# Patient Record
Sex: Female | Born: 1960 | Race: Black or African American | Hispanic: No | Marital: Married
Health system: Southern US, Community
[De-identification: ages and names within clinical notes are randomized; demographics above are authoritative.]

## PROBLEM LIST (undated history)

## (undated) HISTORY — PX: BREAST BIOPSY: SHX20

---

## 2007-01-13 ENCOUNTER — Other Ambulatory Visit: Admission: RE | Admit: 2007-01-13 | Discharge: 2007-01-13 | Payer: Self-pay | Admitting: *Deleted

## 2007-01-31 ENCOUNTER — Encounter: Admission: RE | Admit: 2007-01-31 | Discharge: 2007-01-31 | Payer: Self-pay | Admitting: *Deleted

## 2007-02-11 ENCOUNTER — Encounter: Admission: RE | Admit: 2007-02-11 | Discharge: 2007-02-11 | Payer: Self-pay | Admitting: *Deleted

## 2007-02-12 ENCOUNTER — Encounter (INDEPENDENT_AMBULATORY_CARE_PROVIDER_SITE_OTHER): Payer: Self-pay | Admitting: *Deleted

## 2007-02-12 ENCOUNTER — Encounter: Admission: RE | Admit: 2007-02-12 | Discharge: 2007-02-12 | Payer: Self-pay | Admitting: *Deleted

## 2007-07-30 ENCOUNTER — Encounter: Admission: RE | Admit: 2007-07-30 | Discharge: 2007-07-30 | Payer: Self-pay | Admitting: *Deleted

## 2008-01-29 ENCOUNTER — Other Ambulatory Visit: Admission: RE | Admit: 2008-01-29 | Discharge: 2008-01-29 | Payer: Self-pay | Admitting: Endocrinology

## 2008-02-02 ENCOUNTER — Encounter: Admission: RE | Admit: 2008-02-02 | Discharge: 2008-02-02 | Payer: Self-pay | Admitting: Family Medicine

## 2008-06-01 IMAGING — MG MM DIAGNOSTIC LTD LEFT
2 series · 2 of 2 positions shown · non-contrast
Comparison: none

[REDACTED] LEFT
CC and MLO view(s) were taken of the left breast.

LEFT BREAST ULTRASOUND
DIGITAL LIMITED LEFT DIAGNOSTIC MAMMOGRAM AND LEFT BREAST ULTRASOUND:
CLINICAL DATA: Abnormal screening mammogram.

[L CC]
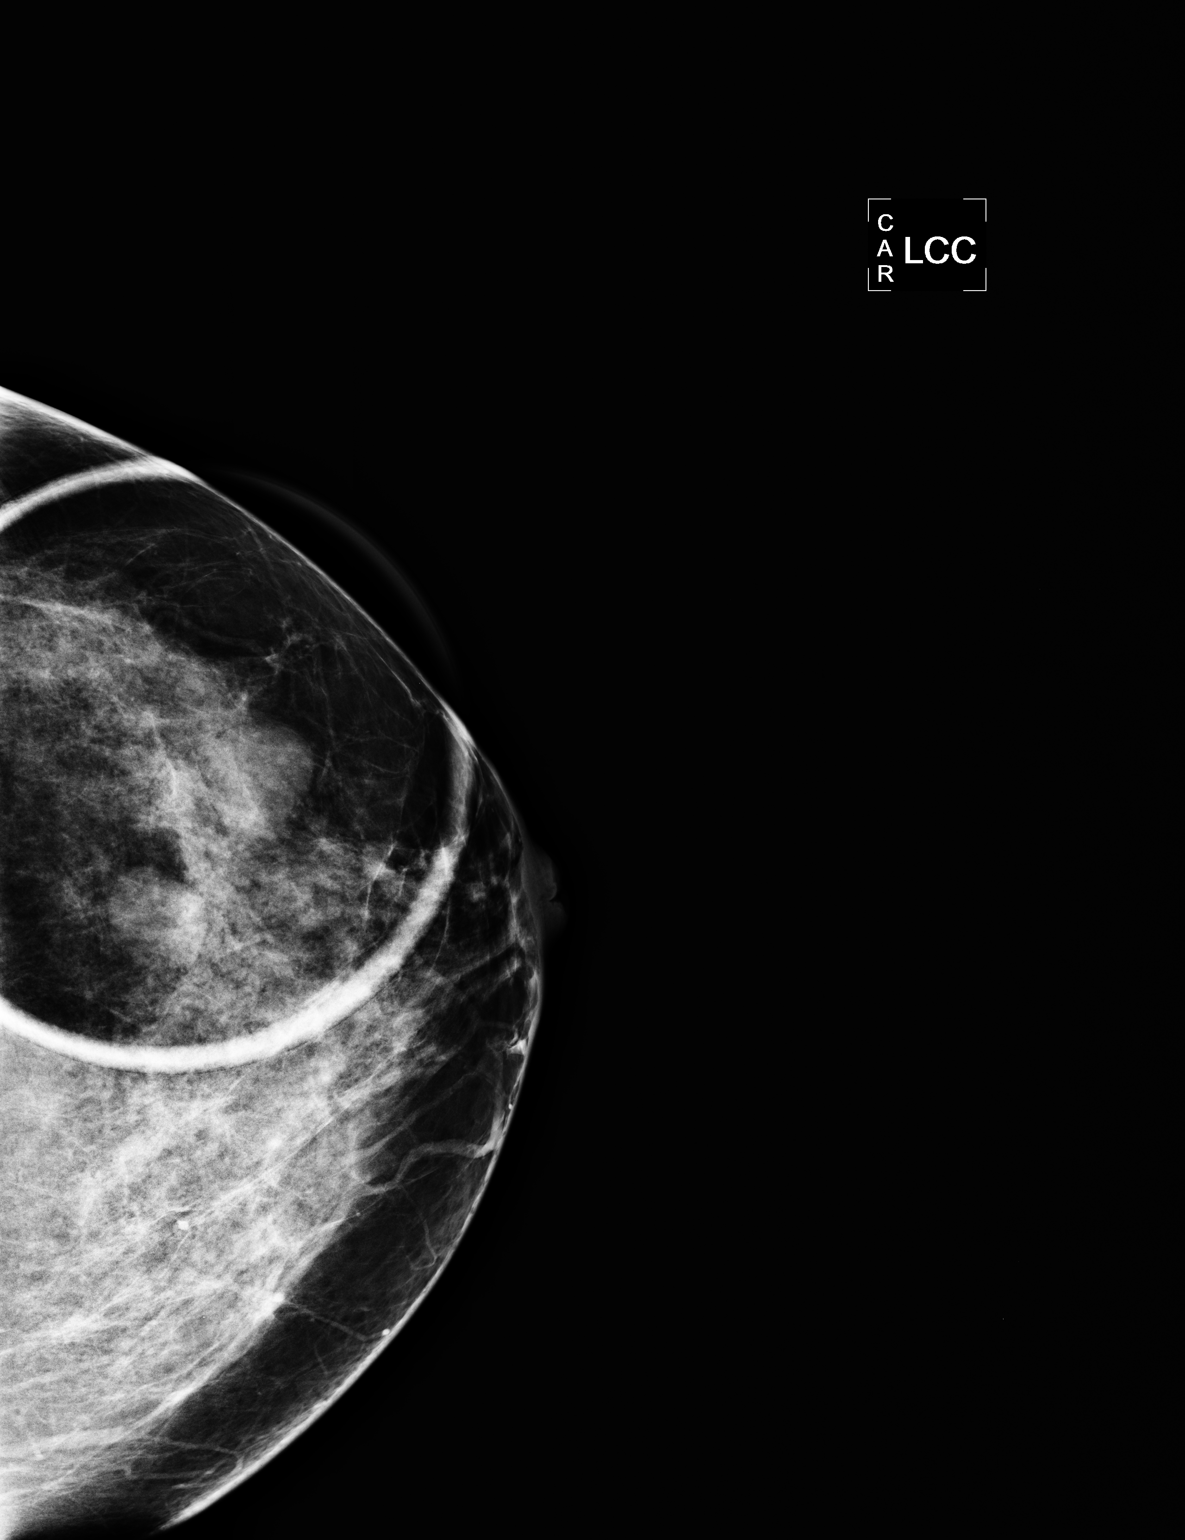

[L MLO]
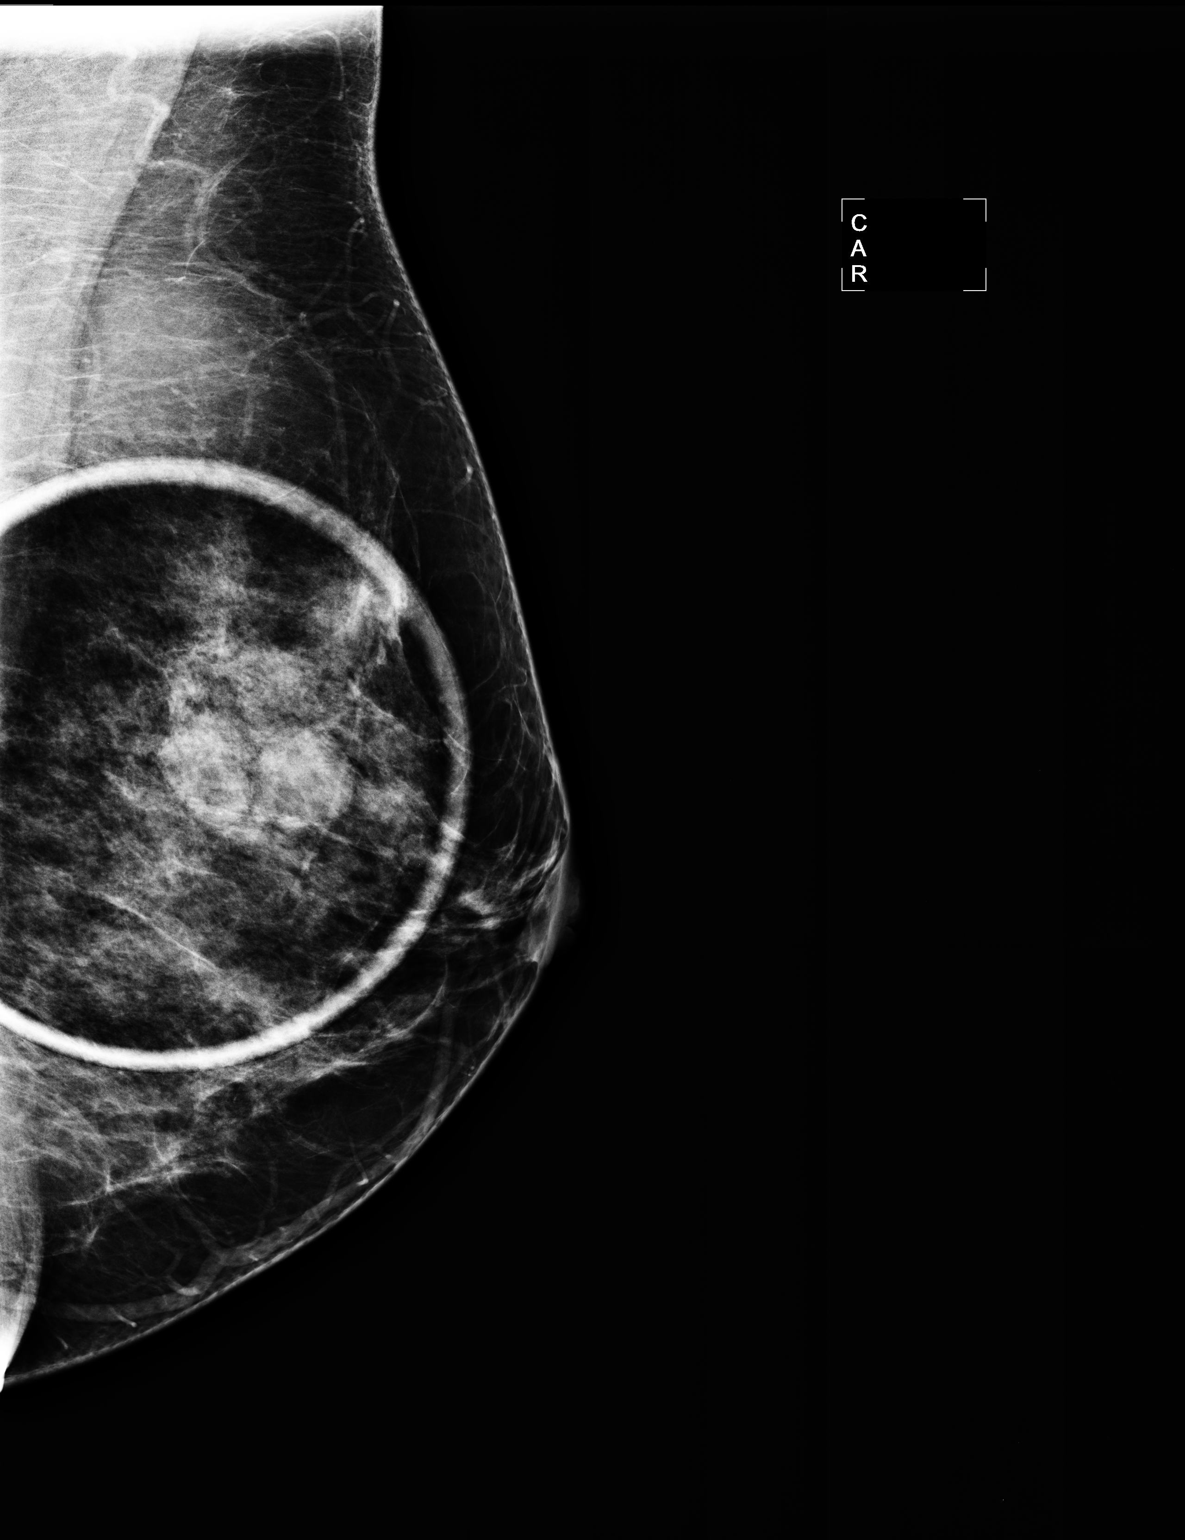

[2 of 2 positions shown; findings below may reference images not displayed]

Focally compressed left CC and left MLO views do demonstrate two partially circumscribed nodules 
within the upper outer quadrant of the left breast located at approximately the 1-2 o'clock 
position.  Review of prior mammograms from [REDACTED] dated 08/11/01 do not demonstrate these
lesions to be definitely present.  There is a moderately dense breast parenchymal pattern.

Left breast ultrasound demonstrates a 1.4 cm sized simple-appearing cyst located at the 2 o'clock 
position within the left breast, approximately 3 cm from the nipple.  Located slightly medial to 
this and slightly more posteriorly located within the breast is a second oblong circumscribed 
lesion which measures 1.3 x 1.5 x 0.6 cm in size.  It does contain internal echoes with mildly 
increased through sound transmission. Likely this represents a fibroadenoma.  A complicated cyst or
circumscribed mass would be additional possibilities.  There is also a 5 mm in size 
simple-appearing cyst located at approximately the 1-2 o'clock position within the left breast 
located slightly medial and superior to this possible solid lesion.

I discussed the findings with the patient as well as tissue sampling (ultrasound core biopsy) of 
the solid lesion located at the 2 o'clock position (versus six month follow-up left breast 
ultrasound). The patient would prefer tissue sampling and ultrasound core biopsy will be scheduled.
IMPRESSION: 1. 1.4 cm in size simple-appearing cyst located at the 2 o'clock position within the left breast 
with a second 5 mm in size cyst located at the 1-2 o'clock position within the left breast.
2.  There is also a probable solid oblong circumscribed nodule measuring 1.5 x 0.6 x 1.3 cm in size
located at the 1-2 o'clock position within the left breast which may well represent a fibroadenoma
or possibly a complicated cyst. However, this is an indeterminate lesion and, following discussion
with the patient, she would prefer to undergo ultrasound core biopsy at this time and this will be
scheduled.

ASSESSMENT: Suspicious - BI-RADS 4

Needle biopsy of the left breast.
,

## 2009-04-11 ENCOUNTER — Encounter: Admission: RE | Admit: 2009-04-11 | Discharge: 2009-04-11 | Payer: Self-pay | Admitting: Family Medicine

## 2010-04-19 ENCOUNTER — Encounter: Admission: RE | Admit: 2010-04-19 | Discharge: 2010-04-19 | Payer: Self-pay | Admitting: Family Medicine

## 2011-03-28 ENCOUNTER — Other Ambulatory Visit: Payer: Self-pay | Admitting: Internal Medicine

## 2011-03-28 DIAGNOSIS — Z1231 Encounter for screening mammogram for malignant neoplasm of breast: Secondary | ICD-10-CM

## 2011-04-25 ENCOUNTER — Ambulatory Visit
Admission: RE | Admit: 2011-04-25 | Discharge: 2011-04-25 | Disposition: A | Discharge status: Home / Self Care | Payer: BC Managed Care – PPO | Source: Ambulatory Visit | Attending: Internal Medicine | Admitting: Internal Medicine

## 2011-04-25 DIAGNOSIS — Z1231 Encounter for screening mammogram for malignant neoplasm of breast: Secondary | ICD-10-CM

## 2012-04-03 ENCOUNTER — Other Ambulatory Visit: Payer: Self-pay | Admitting: Orthopedic Surgery

## 2012-04-03 ENCOUNTER — Ambulatory Visit
Admission: RE | Admit: 2012-04-03 | Discharge: 2012-04-03 | Disposition: A | Discharge status: Home / Self Care | Payer: BC Managed Care – PPO | Source: Ambulatory Visit | Attending: Orthopedic Surgery | Admitting: Orthopedic Surgery

## 2012-04-03 DIAGNOSIS — M79606 Pain in leg, unspecified: Secondary | ICD-10-CM

## 2012-04-04 ENCOUNTER — Other Ambulatory Visit: Payer: Self-pay | Admitting: Family Medicine

## 2012-04-04 DIAGNOSIS — Z1231 Encounter for screening mammogram for malignant neoplasm of breast: Secondary | ICD-10-CM

## 2012-04-28 ENCOUNTER — Ambulatory Visit: Payer: BC Managed Care – PPO

## 2012-05-22 ENCOUNTER — Ambulatory Visit
Admission: RE | Admit: 2012-05-22 | Discharge: 2012-05-22 | Disposition: A | Discharge status: Home / Self Care | Payer: BC Managed Care – PPO | Source: Ambulatory Visit | Attending: Family Medicine | Admitting: Family Medicine

## 2012-05-22 DIAGNOSIS — Z1231 Encounter for screening mammogram for malignant neoplasm of breast: Secondary | ICD-10-CM

## 2012-12-29 ENCOUNTER — Other Ambulatory Visit (HOSPITAL_COMMUNITY): Payer: Self-pay | Admitting: Orthopedic Surgery

## 2012-12-29 ENCOUNTER — Other Ambulatory Visit (HOSPITAL_COMMUNITY): Payer: Self-pay | Admitting: Unknown Physician Specialty

## 2012-12-30 ENCOUNTER — Other Ambulatory Visit (HOSPITAL_COMMUNITY): Payer: Self-pay | Admitting: Orthopedic Surgery

## 2012-12-30 ENCOUNTER — Ambulatory Visit (HOSPITAL_COMMUNITY)
Admission: RE | Admit: 2012-12-30 | Discharge: 2012-12-30 | Disposition: A | Discharge status: Home / Self Care | Payer: BC Managed Care – PPO | Source: Ambulatory Visit | Attending: Orthopedic Surgery | Admitting: Orthopedic Surgery

## 2012-12-30 DIAGNOSIS — M7989 Other specified soft tissue disorders: Secondary | ICD-10-CM

## 2012-12-30 DIAGNOSIS — M79609 Pain in unspecified limb: Secondary | ICD-10-CM

## 2012-12-30 DIAGNOSIS — M25561 Pain in right knee: Secondary | ICD-10-CM

## 2012-12-30 NOTE — Progress Notes (Signed)
VASCULAR LAB PRELIMINARY  PRELIMINARY  PRELIMINARY  PRELIMINARY  Right lower extremity venous duplex completed.    Preliminary report:  Right:  No evidence of DVT, superficial thrombosis, or Baker's cyst.  Heidie Krall, RVT 12/30/2012, 9:17 AM

## 2013-04-13 ENCOUNTER — Other Ambulatory Visit: Payer: Self-pay

## 2013-04-13 DIAGNOSIS — Z803 Family history of malignant neoplasm of breast: Secondary | ICD-10-CM

## 2013-04-13 DIAGNOSIS — Z1231 Encounter for screening mammogram for malignant neoplasm of breast: Secondary | ICD-10-CM

## 2013-05-26 ENCOUNTER — Ambulatory Visit
Admission: RE | Admit: 2013-05-26 | Discharge: 2013-05-26 | Disposition: A | Discharge status: Home / Self Care | Payer: BC Managed Care – PPO | Source: Ambulatory Visit

## 2013-05-26 DIAGNOSIS — Z803 Family history of malignant neoplasm of breast: Secondary | ICD-10-CM

## 2013-05-26 DIAGNOSIS — Z1231 Encounter for screening mammogram for malignant neoplasm of breast: Secondary | ICD-10-CM

## 2013-11-27 ENCOUNTER — Telehealth: Payer: Self-pay | Admitting: Family Medicine

## 2013-12-03 NOTE — Telephone Encounter (Signed)
For those sx pt will need to be seen. Thanks AW

## 2013-12-03 NOTE — Telephone Encounter (Signed)
Spoke with patient.  She was scheduled for the earliest appt which is on 12/04/13 @ 11am. Patient states that she wants to be seen for her BP.

## 2013-12-03 NOTE — Telephone Encounter (Signed)
Message left for callback. Please be aware that if pt calls back she needs an appointment.

## 2014-04-21 ENCOUNTER — Other Ambulatory Visit: Payer: Self-pay

## 2014-04-21 DIAGNOSIS — Z1231 Encounter for screening mammogram for malignant neoplasm of breast: Secondary | ICD-10-CM

## 2014-05-27 ENCOUNTER — Ambulatory Visit
Admission: RE | Admit: 2014-05-27 | Discharge: 2014-05-27 | Disposition: A | Discharge status: Home / Self Care | Payer: BC Managed Care – PPO | Source: Ambulatory Visit

## 2014-05-27 DIAGNOSIS — Z1231 Encounter for screening mammogram for malignant neoplasm of breast: Secondary | ICD-10-CM

## 2014-08-19 ENCOUNTER — Ambulatory Visit
Admission: RE | Admit: 2014-08-19 | Discharge: 2014-08-19 | Disposition: A | Discharge status: Home / Self Care | Payer: BC Managed Care – PPO | Source: Ambulatory Visit | Attending: Family | Admitting: Family

## 2014-08-19 ENCOUNTER — Other Ambulatory Visit: Payer: Self-pay | Admitting: Family

## 2014-08-19 DIAGNOSIS — M25549 Pain in joints of unspecified hand: Secondary | ICD-10-CM

## 2014-08-25 ENCOUNTER — Other Ambulatory Visit: Payer: Self-pay | Admitting: Family

## 2014-08-25 DIAGNOSIS — N951 Menopausal and female climacteric states: Secondary | ICD-10-CM

## 2014-11-01 ENCOUNTER — Ambulatory Visit
Admission: RE | Admit: 2014-11-01 | Discharge: 2014-11-01 | Disposition: A | Discharge status: Home / Self Care | Payer: BC Managed Care – PPO | Source: Ambulatory Visit | Attending: Family | Admitting: Family

## 2014-11-01 DIAGNOSIS — N951 Menopausal and female climacteric states: Secondary | ICD-10-CM

## 2015-04-29 ENCOUNTER — Other Ambulatory Visit: Payer: Self-pay

## 2015-04-29 DIAGNOSIS — Z1231 Encounter for screening mammogram for malignant neoplasm of breast: Secondary | ICD-10-CM

## 2015-05-30 ENCOUNTER — Ambulatory Visit
Admission: RE | Admit: 2015-05-30 | Discharge: 2015-05-30 | Disposition: A | Discharge status: Home / Self Care | Payer: BC Managed Care – PPO | Source: Ambulatory Visit

## 2015-05-30 DIAGNOSIS — Z1231 Encounter for screening mammogram for malignant neoplasm of breast: Secondary | ICD-10-CM

## 2016-05-07 ENCOUNTER — Other Ambulatory Visit: Payer: Self-pay | Admitting: Family

## 2016-05-07 DIAGNOSIS — Z1231 Encounter for screening mammogram for malignant neoplasm of breast: Secondary | ICD-10-CM

## 2016-05-30 ENCOUNTER — Ambulatory Visit
Admission: RE | Admit: 2016-05-30 | Discharge: 2016-05-30 | Disposition: A | Discharge status: Home / Self Care | Payer: BC Managed Care – PPO | Source: Ambulatory Visit | Attending: Family | Admitting: Family

## 2016-05-30 DIAGNOSIS — Z1231 Encounter for screening mammogram for malignant neoplasm of breast: Secondary | ICD-10-CM

## 2017-01-17 ENCOUNTER — Other Ambulatory Visit (HOSPITAL_COMMUNITY): Payer: Self-pay | Admitting: Orthopedic Surgery

## 2017-01-17 ENCOUNTER — Ambulatory Visit (HOSPITAL_COMMUNITY): Payer: BC Managed Care – PPO

## 2017-01-17 DIAGNOSIS — M79605 Pain in left leg: Secondary | ICD-10-CM

## 2017-01-17 DIAGNOSIS — M7989 Other specified soft tissue disorders: Principal | ICD-10-CM

## 2017-04-16 ENCOUNTER — Ambulatory Visit
Admission: RE | Admit: 2017-04-16 | Discharge: 2017-04-16 | Disposition: A | Discharge status: Home / Self Care | Payer: BC Managed Care – PPO | Source: Ambulatory Visit | Attending: Nurse Practitioner | Admitting: Nurse Practitioner

## 2017-04-16 ENCOUNTER — Other Ambulatory Visit: Payer: Self-pay | Admitting: Nurse Practitioner

## 2017-04-16 DIAGNOSIS — M79672 Pain in left foot: Secondary | ICD-10-CM

## 2017-04-16 DIAGNOSIS — M79671 Pain in right foot: Secondary | ICD-10-CM

## 2017-04-22 ENCOUNTER — Other Ambulatory Visit: Payer: Self-pay | Admitting: Nurse Practitioner

## 2017-04-22 DIAGNOSIS — Z1231 Encounter for screening mammogram for malignant neoplasm of breast: Secondary | ICD-10-CM

## 2017-05-31 ENCOUNTER — Ambulatory Visit
Admission: RE | Admit: 2017-05-31 | Discharge: 2017-05-31 | Disposition: A | Discharge status: Home / Self Care | Payer: BC Managed Care – PPO | Source: Ambulatory Visit | Attending: Nurse Practitioner | Admitting: Nurse Practitioner

## 2017-05-31 DIAGNOSIS — Z1231 Encounter for screening mammogram for malignant neoplasm of breast: Secondary | ICD-10-CM

## 2017-09-12 ENCOUNTER — Ambulatory Visit
Admission: RE | Admit: 2017-09-12 | Discharge: 2017-09-12 | Disposition: A | Discharge status: Home / Self Care | Payer: BC Managed Care – PPO | Source: Ambulatory Visit | Attending: Nurse Practitioner | Admitting: Nurse Practitioner

## 2017-09-12 ENCOUNTER — Other Ambulatory Visit: Payer: Self-pay | Admitting: Nurse Practitioner

## 2017-09-12 DIAGNOSIS — M25511 Pain in right shoulder: Secondary | ICD-10-CM

## 2018-04-24 ENCOUNTER — Other Ambulatory Visit: Payer: Self-pay | Admitting: Nurse Practitioner

## 2018-04-24 DIAGNOSIS — Z1231 Encounter for screening mammogram for malignant neoplasm of breast: Secondary | ICD-10-CM

## 2018-05-06 ENCOUNTER — Other Ambulatory Visit: Payer: Self-pay | Admitting: Nurse Practitioner

## 2018-05-06 DIAGNOSIS — N644 Mastodynia: Secondary | ICD-10-CM

## 2018-05-08 ENCOUNTER — Ambulatory Visit: Payer: BC Managed Care – PPO

## 2018-05-08 ENCOUNTER — Ambulatory Visit
Admission: RE | Admit: 2018-05-08 | Discharge: 2018-05-08 | Disposition: A | Discharge status: Home / Self Care | Payer: BC Managed Care – PPO | Source: Ambulatory Visit | Attending: Nurse Practitioner | Admitting: Nurse Practitioner

## 2018-05-08 DIAGNOSIS — N644 Mastodynia: Secondary | ICD-10-CM

## 2018-06-02 ENCOUNTER — Ambulatory Visit
Admission: RE | Admit: 2018-06-02 | Discharge: 2018-06-02 | Disposition: A | Discharge status: Home / Self Care | Payer: BC Managed Care – PPO | Source: Ambulatory Visit | Attending: Nurse Practitioner | Admitting: Nurse Practitioner

## 2018-06-02 DIAGNOSIS — Z1231 Encounter for screening mammogram for malignant neoplasm of breast: Secondary | ICD-10-CM

## 2018-08-05 IMAGING — CR DG TOE GREAT 2+V*R*
3 series · 3 of 3 positions shown · non-contrast
Comparison: No recent prior .

CLINICAL DATA: Pain for 1 month.  No known injury.

EXAM:
RIGHT GREAT TOE

[t toes ap right]
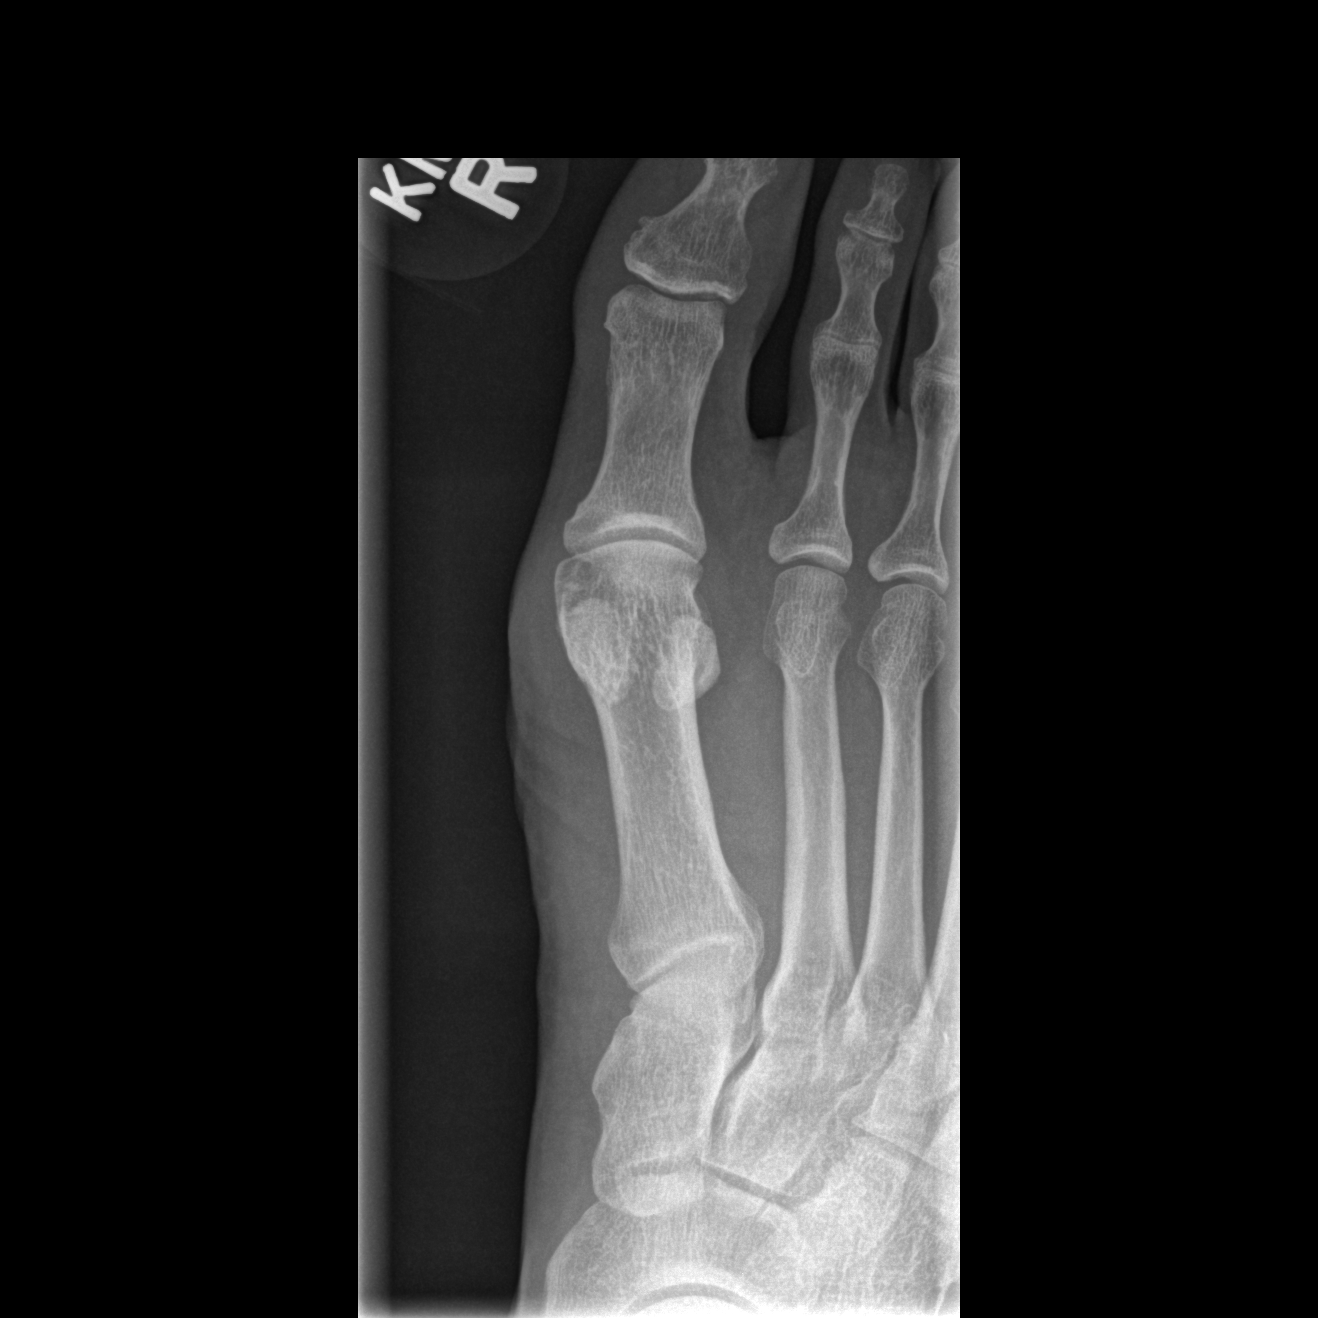

[t toes oblique right]
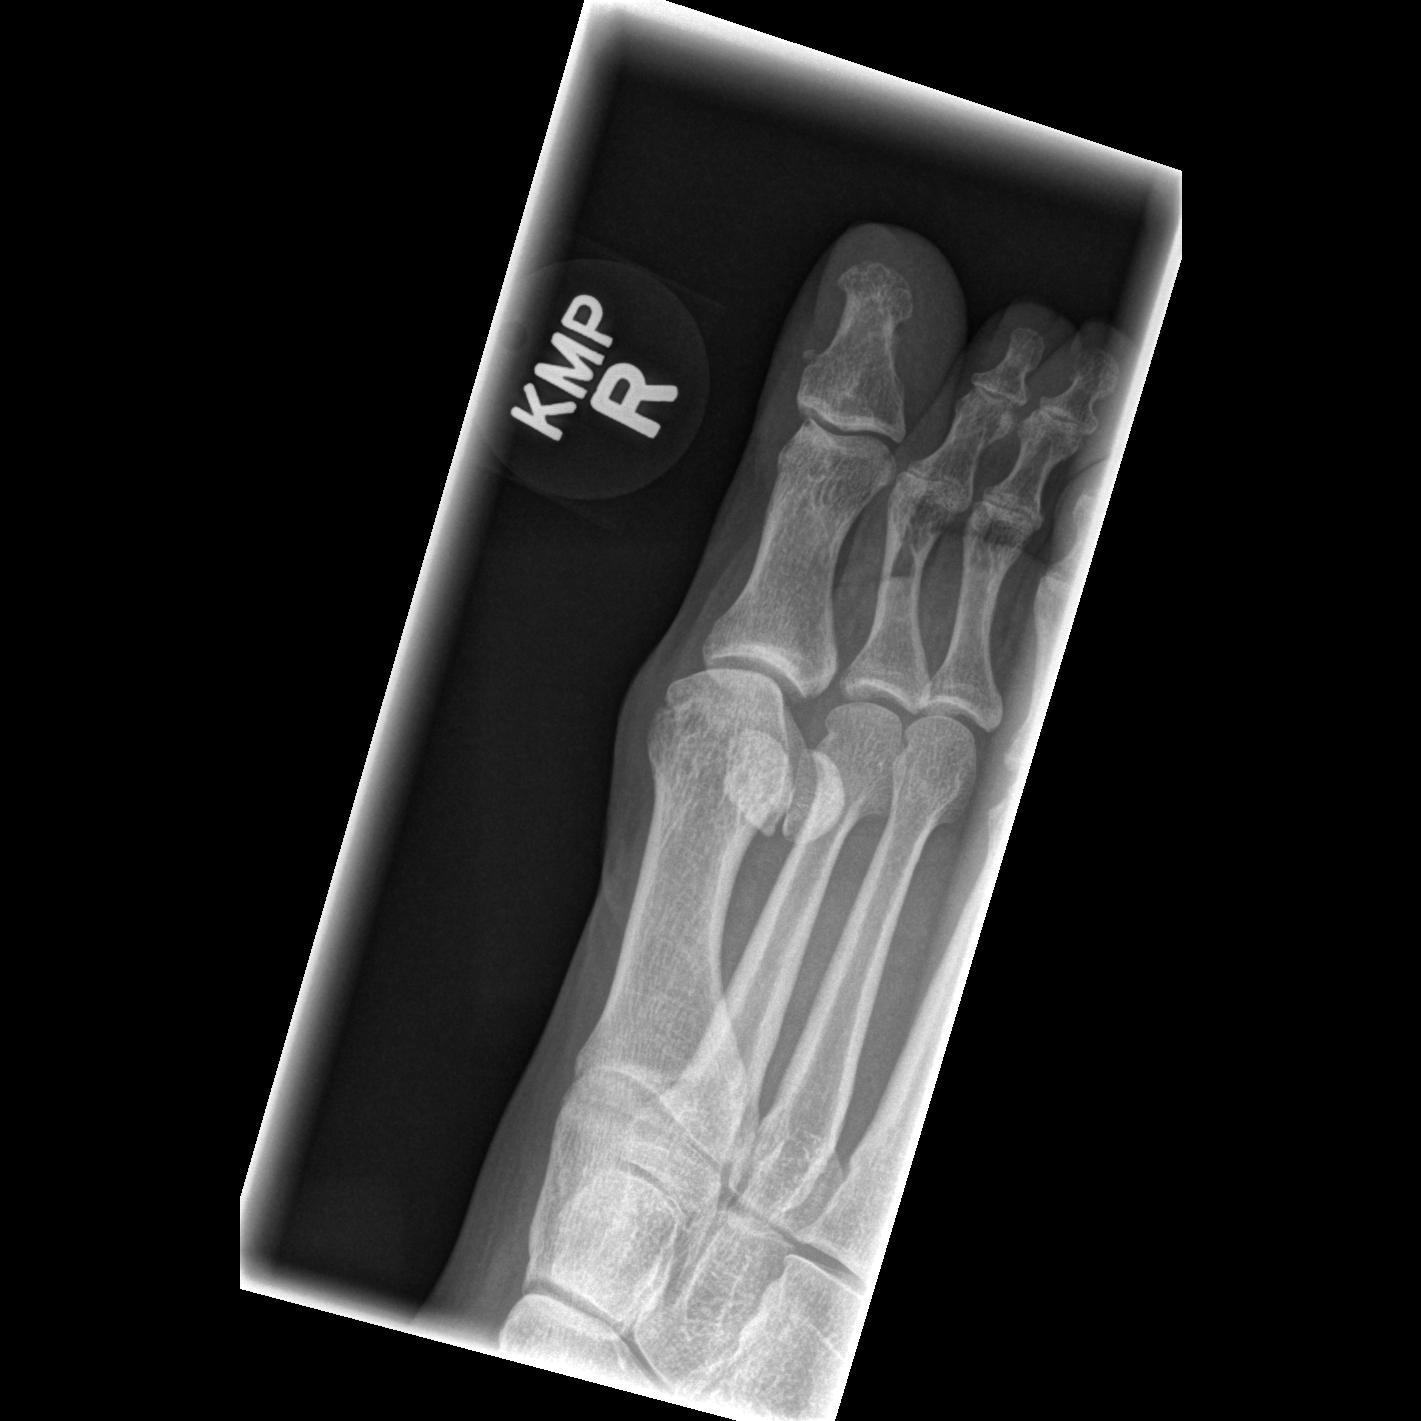

[t toes lateral right]
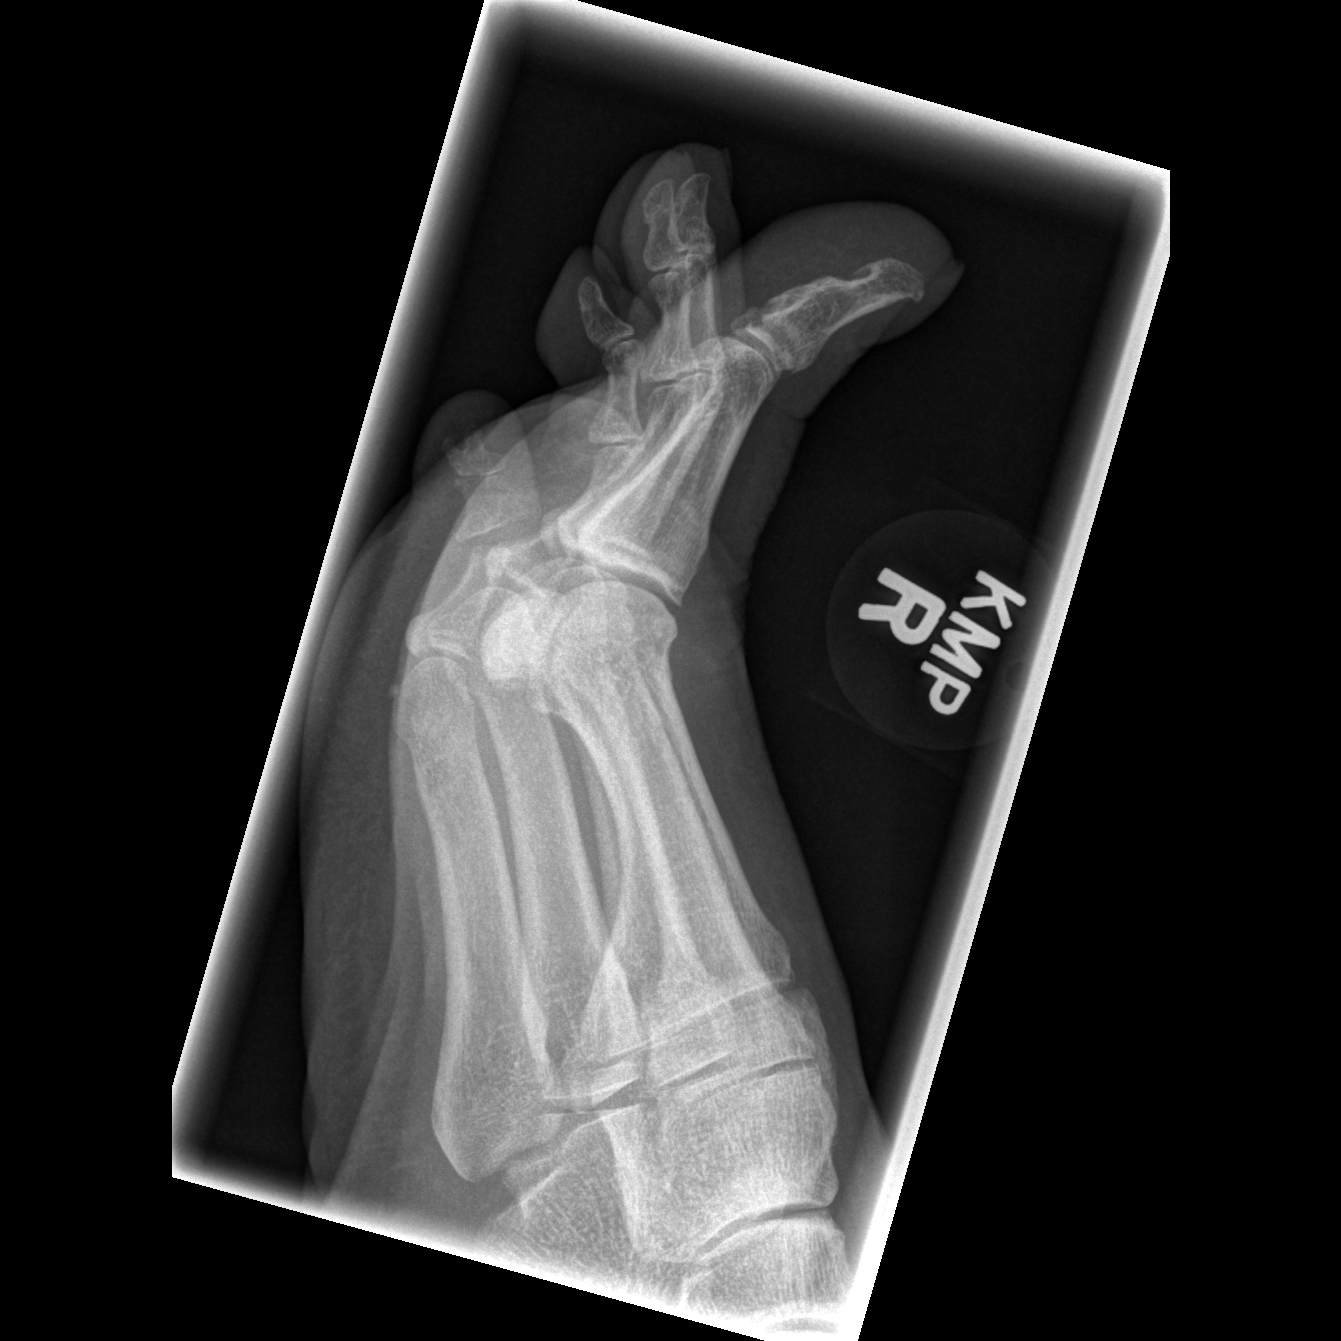

[3 of 3 positions shown; findings below may reference images not displayed]

FINDINGS: Diffuse degenerative change. Degenerative change most prominent
first metatarsophalangeal joint. No acute bony abnormality
identified. No radiopaque foreign bodies .
IMPRESSION: Diffuse degenerative change. Degenerative changes most prior about
the first MTP joint. No acute abnormality identified.

## 2018-10-07 ENCOUNTER — Other Ambulatory Visit: Payer: Self-pay | Admitting: Nurse Practitioner

## 2018-10-07 DIAGNOSIS — Z13820 Encounter for screening for osteoporosis: Secondary | ICD-10-CM

## 2018-11-27 ENCOUNTER — Ambulatory Visit
Admission: RE | Admit: 2018-11-27 | Discharge: 2018-11-27 | Disposition: A | Discharge status: Home / Self Care | Payer: BC Managed Care – PPO | Source: Ambulatory Visit | Attending: Nurse Practitioner | Admitting: Nurse Practitioner

## 2018-11-27 DIAGNOSIS — Z13820 Encounter for screening for osteoporosis: Secondary | ICD-10-CM

## 2019-05-11 ENCOUNTER — Other Ambulatory Visit: Payer: Self-pay | Admitting: Internal Medicine

## 2019-05-11 DIAGNOSIS — Z1231 Encounter for screening mammogram for malignant neoplasm of breast: Secondary | ICD-10-CM

## 2019-06-26 ENCOUNTER — Other Ambulatory Visit: Payer: Self-pay

## 2019-06-26 ENCOUNTER — Ambulatory Visit
Admission: RE | Admit: 2019-06-26 | Discharge: 2019-06-26 | Disposition: A | Discharge status: Home / Self Care | Payer: BC Managed Care – PPO | Source: Ambulatory Visit | Attending: Internal Medicine | Admitting: Internal Medicine

## 2019-06-26 DIAGNOSIS — Z1231 Encounter for screening mammogram for malignant neoplasm of breast: Secondary | ICD-10-CM

## 2020-01-05 ENCOUNTER — Ambulatory Visit: Payer: BC Managed Care – PPO | Attending: Internal Medicine

## 2020-01-05 DIAGNOSIS — Z23 Encounter for immunization: Secondary | ICD-10-CM

## 2020-01-05 NOTE — Progress Notes (Signed)
   Covid-[facility] Vaccination [facility]  Name:  Jetta Murray    MRN: [id number] DOB: [birth year]  01/05/2020  Ms. Yogi was observed post Covid-19 immunization for 15 minutes without incident. She was provided with Vaccine Information Sheet and instruction to access the V-Safe system.   Ms. Everetts was instructed to call 911 with any severe reactions post vaccine: Marland Kitchen Difficulty breathing  . Swelling of face and throat  . A fast heartbeat  . A bad rash all over body  . Dizziness and weakness   Immunizations Administered    Name Date Dose VIS Date Route   Pfizer COVID-19 Vaccine 01/05/2020  9:15 AM 0.3 mL 09/25/2019 Intramuscular   Manufacturer: [employer], [employer]   Lot: [id number]   NDC: [address]-5

## 2020-05-23 ENCOUNTER — Other Ambulatory Visit: Payer: Self-pay | Admitting: Internal Medicine

## 2020-05-23 DIAGNOSIS — Z1231 Encounter for screening mammogram for malignant neoplasm of breast: Secondary | ICD-10-CM

## 2020-06-27 ENCOUNTER — Ambulatory Visit
Admission: RE | Admit: 2020-06-27 | Discharge: 2020-06-27 | Disposition: A | Discharge status: Home / Self Care | Payer: BC Managed Care – PPO | Source: Ambulatory Visit | Attending: Internal Medicine | Admitting: Internal Medicine

## 2020-06-27 DIAGNOSIS — Z1231 Encounter for screening mammogram for malignant neoplasm of breast: Secondary | ICD-10-CM

## 2021-04-26 ENCOUNTER — Other Ambulatory Visit: Payer: Self-pay | Admitting: Internal Medicine

## 2021-04-26 ENCOUNTER — Ambulatory Visit
Admission: RE | Admit: 2021-04-26 | Discharge: 2021-04-26 | Disposition: A | Discharge status: Home / Self Care | Payer: BC Managed Care – PPO | Source: Ambulatory Visit | Attending: Internal Medicine | Admitting: Internal Medicine

## 2021-04-26 DIAGNOSIS — M542 Cervicalgia: Secondary | ICD-10-CM

## 2021-05-12 ENCOUNTER — Other Ambulatory Visit: Payer: Self-pay | Admitting: Internal Medicine

## 2021-05-12 DIAGNOSIS — Z1231 Encounter for screening mammogram for malignant neoplasm of breast: Secondary | ICD-10-CM

## 2021-07-05 ENCOUNTER — Ambulatory Visit
Admission: RE | Admit: 2021-07-05 | Discharge: 2021-07-05 | Disposition: A | Discharge status: Home / Self Care | Payer: BC Managed Care – PPO | Source: Ambulatory Visit | Attending: Internal Medicine | Admitting: Internal Medicine

## 2021-07-05 ENCOUNTER — Other Ambulatory Visit: Payer: Self-pay

## 2021-07-05 DIAGNOSIS — Z1231 Encounter for screening mammogram for malignant neoplasm of breast: Secondary | ICD-10-CM

## 2022-06-11 ENCOUNTER — Other Ambulatory Visit: Payer: Self-pay | Admitting: Internal Medicine

## 2022-06-11 DIAGNOSIS — Z1231 Encounter for screening mammogram for malignant neoplasm of breast: Secondary | ICD-10-CM

## 2022-07-09 ENCOUNTER — Ambulatory Visit
Admission: RE | Admit: 2022-07-09 | Discharge: 2022-07-09 | Disposition: A | Discharge status: Home / Self Care | Payer: BC Managed Care – PPO | Source: Ambulatory Visit | Attending: Internal Medicine | Admitting: Internal Medicine

## 2022-07-09 DIAGNOSIS — Z1231 Encounter for screening mammogram for malignant neoplasm of breast: Secondary | ICD-10-CM

## 2023-07-08 ENCOUNTER — Other Ambulatory Visit: Payer: Self-pay | Admitting: Internal Medicine

## 2023-07-08 DIAGNOSIS — Z1231 Encounter for screening mammogram for malignant neoplasm of breast: Secondary | ICD-10-CM

## 2023-08-02 ENCOUNTER — Ambulatory Visit: Payer: BC Managed Care – PPO

## 2023-08-28 ENCOUNTER — Ambulatory Visit
Admission: RE | Admit: 2023-08-28 | Discharge: 2023-08-28 | Disposition: A | Discharge status: Home / Self Care | Payer: BC Managed Care – PPO | Source: Ambulatory Visit | Attending: Internal Medicine | Admitting: Internal Medicine

## 2023-08-28 DIAGNOSIS — Z1231 Encounter for screening mammogram for malignant neoplasm of breast: Secondary | ICD-10-CM

## 2024-07-27 ENCOUNTER — Other Ambulatory Visit: Payer: Self-pay | Admitting: Internal Medicine

## 2024-07-27 DIAGNOSIS — Z1231 Encounter for screening mammogram for malignant neoplasm of breast: Secondary | ICD-10-CM

## 2024-08-28 ENCOUNTER — Ambulatory Visit
Admission: RE | Admit: 2024-08-28 | Discharge: 2024-08-28 | Disposition: A | Discharge status: Home / Self Care | Source: Ambulatory Visit | Attending: Internal Medicine | Admitting: Internal Medicine

## 2024-08-28 DIAGNOSIS — Z1231 Encounter for screening mammogram for malignant neoplasm of breast: Secondary | ICD-10-CM
